# Patient Record
Sex: Male | Born: 1935 | Race: White | Hispanic: No | Marital: Married | Smoking: Never smoker
Health system: Southern US, Community
[De-identification: ages and names within clinical notes are randomized; demographics above are authoritative.]

## PROBLEM LIST (undated history)

## (undated) DIAGNOSIS — I4891 Unspecified atrial fibrillation: Secondary | ICD-10-CM

## (undated) DIAGNOSIS — C61 Malignant neoplasm of prostate: Secondary | ICD-10-CM

## (undated) DIAGNOSIS — C159 Malignant neoplasm of esophagus, unspecified: Secondary | ICD-10-CM

## (undated) DIAGNOSIS — I10 Essential (primary) hypertension: Secondary | ICD-10-CM

## (undated) HISTORY — PX: PROSTATE SURGERY: SHX751

## (undated) HISTORY — PX: APPENDECTOMY: SHX54

---

## 2006-01-22 ENCOUNTER — Ambulatory Visit (HOSPITAL_COMMUNITY): Admission: RE | Admit: 2006-01-22 | Discharge: 2006-01-22 | Payer: Self-pay | Admitting: Urology

## 2006-01-25 ENCOUNTER — Ambulatory Visit: Admission: RE | Admit: 2006-01-25 | Discharge: 2006-05-22 | Payer: Self-pay | Admitting: Radiation Oncology

## 2006-03-14 ENCOUNTER — Ambulatory Visit (HOSPITAL_BASED_OUTPATIENT_CLINIC_OR_DEPARTMENT_OTHER): Admission: RE | Admit: 2006-03-14 | Discharge: 2006-03-14 | Payer: Self-pay | Admitting: Urology

## 2006-05-09 ENCOUNTER — Ambulatory Visit: Admission: RE | Admit: 2006-05-09 | Discharge: 2006-05-22 | Payer: Self-pay | Admitting: Radiation Oncology

## 2008-01-31 ENCOUNTER — Emergency Department (HOSPITAL_BASED_OUTPATIENT_CLINIC_OR_DEPARTMENT_OTHER): Admission: EM | Admit: 2008-01-31 | Discharge: 2008-01-31 | Payer: Self-pay | Admitting: Emergency Medicine

## 2010-07-28 NOTE — Op Note (Signed)
NAMEAYHAM, WORD                ACCOUNT NO.:  [id number]   MEDICAL RECORD NO.:  [id number]          PATIENT TYPE:  AMB   LOCATION:  [facility]                         FACILITY:  [facility]   PHYSICIAN:  Boston Service, M.D.DATE OF BIRTH:  April 02, 1931   DATE OF PROCEDURE:  03/14/2006  DATE OF DISCHARGE:                               OPERATIVE REPORT   PREOPERATIVE DIAGNOSIS:  A 78 year old male with recent biopsy of the  prostate showed a Gleason 6 adenocarcinoma involving 10% of the biopsies  on the right, 10% of the biopsies on the left.  Reference is made to  office notes from 01/03/2006, 12/28/2005, 12/25/2005, as well as  clinical notes from Dr. Dayton Scrape and Dr. Kathrynn Speed.   POSTOPERATIVE DIAGNOSIS:  A 77 year old male with recent biopsy of the  prostate showed a Gleason 6 adenocarcinoma involving 10% of the biopsies  on the right, 10% of the biopsies on the left.  Reference is made to  office notes from 01/03/2006, 12/28/2005, 12/25/2005, as well as  clinical notes from Dr. Dayton Scrape and Dr. Kathrynn Speed.   PROCEDURE:  Transperineal placement of radioactive I-125 seeds.   DESCRIPTION OF PROCEDURE:  The patient was prepped and draped in the  dorsal lithotomy position after institution of adequate level of general  anesthesia.  Rectal tube, transrectal ultrasound probe, indwelling Foley  catheter and suprapubic fluoroscopy unit were all positioned.  Duplicate  transverse and longitudinal images of the prostate were obtained in  order to aid with planting of the implant.  Once sequential transverse  and longitudinal images had been obtained,  [employer] was used  to plan the implant.  Once appropriate planning had been carried out, a  total of 22 needles were used to deliver total of 66 seeds.  Activity  per seed 28.0500 mCi per seed of a I-125 isotope.  Seeds passed without  difficulty.  Placement appeared appropriate by transrectal ultrasound,  as well as suprapubic fluoroscopy.  Once all  seeds had been positioned  appropriate fluoroscopic images were obtained.  Rectal tube, transrectal  ultrasound probe and indwelling Foley catheter were removed.  Fiberoptic  cystoscopy showed a normal urethra and sphincter, nonobstructive  prostate.  Bladder showed clear reflux right and left orifice and no  evidence of bleeding site or seed within the bladder.  Cystoscope was  removed.  Foley catheter was placed to straight drain.  The patient was  returned to recovery in satisfactory condition.           ______________________________  Boston Service, M.D.     RH/MEDQ  D:  03/14/2006  T:  03/14/2006  Job:  [id number]   cc:   Maryln Gottron, M.D.  Fax: [phone]   Karle Plumber, MD  [address]  [place], [place] [address]

## 2011-03-28 ENCOUNTER — Encounter (HOSPITAL_BASED_OUTPATIENT_CLINIC_OR_DEPARTMENT_OTHER): Payer: Medicare Other | Attending: Plastic Surgery

## 2011-03-28 ENCOUNTER — Other Ambulatory Visit (HOSPITAL_COMMUNITY): Payer: Self-pay | Admitting: Plastic Surgery

## 2011-03-28 ENCOUNTER — Ambulatory Visit (HOSPITAL_COMMUNITY)
Admission: RE | Admit: 2011-03-28 | Discharge: 2011-03-28 | Disposition: A | Discharge status: Home / Self Care | Payer: Medicare Other | Source: Ambulatory Visit | Attending: Plastic Surgery | Admitting: Plastic Surgery

## 2011-03-28 DIAGNOSIS — Z79899 Other long term (current) drug therapy: Secondary | ICD-10-CM | POA: Insufficient documentation

## 2011-03-28 DIAGNOSIS — Y849 Medical procedure, unspecified as the cause of abnormal reaction of the patient, or of later complication, without mention of misadventure at the time of the procedure: Secondary | ICD-10-CM | POA: Insufficient documentation

## 2011-03-28 DIAGNOSIS — T8189XA Other complications of procedures, not elsewhere classified, initial encounter: Secondary | ICD-10-CM | POA: Insufficient documentation

## 2011-03-28 DIAGNOSIS — C61 Malignant neoplasm of prostate: Secondary | ICD-10-CM | POA: Insufficient documentation

## 2011-03-28 DIAGNOSIS — I10 Essential (primary) hypertension: Secondary | ICD-10-CM | POA: Insufficient documentation

## 2011-03-28 DIAGNOSIS — M278 Other specified diseases of jaws: Secondary | ICD-10-CM | POA: Insufficient documentation

## 2011-03-28 DIAGNOSIS — Z01818 Encounter for other preprocedural examination: Secondary | ICD-10-CM | POA: Insufficient documentation

## 2011-03-28 DIAGNOSIS — Y842 Radiological procedure and radiotherapy as the cause of abnormal reaction of the patient, or of later complication, without mention of misadventure at the time of the procedure: Secondary | ICD-10-CM | POA: Insufficient documentation

## 2011-03-28 DIAGNOSIS — C01 Malignant neoplasm of base of tongue: Secondary | ICD-10-CM | POA: Insufficient documentation

## 2011-03-28 DIAGNOSIS — I4891 Unspecified atrial fibrillation: Secondary | ICD-10-CM | POA: Insufficient documentation

## 2011-03-29 NOTE — Progress Notes (Signed)
[facility]  NAME:  Daniel Jefferson, Daniel Jefferson                ACCOUNT NO.:  [id number]  MEDICAL RECORD NO.:  [id number]      DATE OF BIRTH:  Aug 25, 1931  PHYSICIAN:  Wayland Denis, DO       VISIT DATE:  03/28/2011                                  OFFICE VISIT   CHIEF COMPLAINT:  Osteoradionecrosis.  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  He received treatment that included the surgical resection and radiation in 2010.  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 This is painful and is also evident in the x-ray for the extensive disease involved.  He has an impacted 3rd molar on the right mandible as well.  There are signs of fistula formation at the root level as well. Originally for his surgical intervention, he did undergo left selected neck dissection with partial glossectomy which was done in September 2009.  He received 30 treatments of 66 Gy for the radiation portion of his treatment.  PAST MEDICAL HISTORY:  Prostate cancer with seed placement, high blood pressure.  Atrial fibrillation and squamous cell carcinoma of the tongue.  MEDICATIONS:  Lisinopril, metoprolol, hydrochlorothiazide, multivitamin and aspirin.  ALLERGIES:  No known drug allergies.  PAST SURGICAL HISTORY:  Appendectomy, prostate seeds, head and neck surgery for tongue cancer.  SOCIAL HISTORY:  The patient is married and lives at home.  Denies smoking or drinking.  REVIEW OF SYSTEMS:  He had lost some weight secondary to his head and neck treatments and now extensive periodontal disease.  Denies any other issues such as shortness of breath, chest pain, blood in his urine  or stool.  On exam, he is alert, oriented, and cooperative, not in any acute distress.  He has a well-healed scars in the mouth and neck area.  His pupils are equal.  Extraocular muscles are intact.  No cervical lymphadenopathy or tenderness in the neck.  He has some pain in the mouth region and the periodontal  region.  His breathing is unlabored. His heart is regular rate, irregular rhythm.  Abdomen is soft, nontender.  Pulses are strong and regular.  He has some varicosities in his lower extremity, but no extensive disease or swelling.  His oral exam shows extensive cracking, fracturing and decay in the teeth with ulcerations of the gums and signs of fistula formation.  ASSESSMENT:  Osteoradionecrosis with light effect radiation changes.  PLAN:  Strict oral hygiene.  Antibiotics as the disease progresses and recommend hyperbaric oxygen treatment as a modality for preventing extensive fracturing of the bones.     Wayland Denis, DO     CS/MEDQ  D:  03/28/2011  T:  03/29/2011  Job:  [id number]

## 2011-04-16 ENCOUNTER — Encounter (HOSPITAL_BASED_OUTPATIENT_CLINIC_OR_DEPARTMENT_OTHER): Payer: Medicare Other | Attending: Plastic Surgery

## 2011-04-16 DIAGNOSIS — Y842 Radiological procedure and radiotherapy as the cause of abnormal reaction of the patient, or of later complication, without mention of misadventure at the time of the procedure: Secondary | ICD-10-CM | POA: Insufficient documentation

## 2011-04-16 DIAGNOSIS — M278 Other specified diseases of jaws: Secondary | ICD-10-CM | POA: Insufficient documentation

## 2011-05-02 ENCOUNTER — Encounter (HOSPITAL_BASED_OUTPATIENT_CLINIC_OR_DEPARTMENT_OTHER): Payer: Medicare Other

## 2011-05-11 ENCOUNTER — Encounter (HOSPITAL_BASED_OUTPATIENT_CLINIC_OR_DEPARTMENT_OTHER): Payer: Medicare Other | Attending: Plastic Surgery

## 2011-05-11 DIAGNOSIS — Y842 Radiological procedure and radiotherapy as the cause of abnormal reaction of the patient, or of later complication, without mention of misadventure at the time of the procedure: Secondary | ICD-10-CM | POA: Insufficient documentation

## 2011-05-11 DIAGNOSIS — K117 Disturbances of salivary secretion: Secondary | ICD-10-CM | POA: Insufficient documentation

## 2011-05-11 DIAGNOSIS — Z85810 Personal history of malignant neoplasm of tongue: Secondary | ICD-10-CM | POA: Insufficient documentation

## 2011-05-11 DIAGNOSIS — M278 Other specified diseases of jaws: Secondary | ICD-10-CM | POA: Insufficient documentation

## 2011-05-11 DIAGNOSIS — Z8546 Personal history of malignant neoplasm of prostate: Secondary | ICD-10-CM | POA: Insufficient documentation

## 2011-05-11 DIAGNOSIS — Z79899 Other long term (current) drug therapy: Secondary | ICD-10-CM | POA: Insufficient documentation

## 2011-05-11 DIAGNOSIS — K006 Disturbances in tooth eruption: Secondary | ICD-10-CM | POA: Insufficient documentation

## 2011-06-15 ENCOUNTER — Encounter (HOSPITAL_BASED_OUTPATIENT_CLINIC_OR_DEPARTMENT_OTHER): Payer: Medicare Other | Attending: Plastic Surgery

## 2011-06-15 DIAGNOSIS — Y842 Radiological procedure and radiotherapy as the cause of abnormal reaction of the patient, or of later complication, without mention of misadventure at the time of the procedure: Secondary | ICD-10-CM | POA: Insufficient documentation

## 2011-06-15 DIAGNOSIS — M278 Other specified diseases of jaws: Secondary | ICD-10-CM | POA: Insufficient documentation

## 2011-06-15 DIAGNOSIS — Z79899 Other long term (current) drug therapy: Secondary | ICD-10-CM | POA: Insufficient documentation

## 2011-06-15 DIAGNOSIS — Z85810 Personal history of malignant neoplasm of tongue: Secondary | ICD-10-CM | POA: Insufficient documentation

## 2011-06-15 DIAGNOSIS — K006 Disturbances in tooth eruption: Secondary | ICD-10-CM | POA: Insufficient documentation

## 2011-06-15 DIAGNOSIS — K117 Disturbances of salivary secretion: Secondary | ICD-10-CM | POA: Insufficient documentation

## 2011-06-15 DIAGNOSIS — Z8546 Personal history of malignant neoplasm of prostate: Secondary | ICD-10-CM | POA: Insufficient documentation

## 2014-05-12 ENCOUNTER — Emergency Department (HOSPITAL_BASED_OUTPATIENT_CLINIC_OR_DEPARTMENT_OTHER): Payer: Medicare HMO

## 2014-05-12 ENCOUNTER — Encounter (HOSPITAL_BASED_OUTPATIENT_CLINIC_OR_DEPARTMENT_OTHER): Payer: Self-pay | Admitting: *Deleted

## 2014-05-12 ENCOUNTER — Observation Stay (HOSPITAL_BASED_OUTPATIENT_CLINIC_OR_DEPARTMENT_OTHER)
Admission: EM | Admit: 2014-05-12 | Discharge: 2014-05-14 | Disposition: A | Discharge status: Home / Self Care | Payer: Medicare HMO | Attending: Internal Medicine | Admitting: Internal Medicine

## 2014-05-12 DIAGNOSIS — I2510 Atherosclerotic heart disease of native coronary artery without angina pectoris: Secondary | ICD-10-CM | POA: Diagnosis not present

## 2014-05-12 DIAGNOSIS — C61 Malignant neoplasm of prostate: Secondary | ICD-10-CM | POA: Insufficient documentation

## 2014-05-12 DIAGNOSIS — C159 Malignant neoplasm of esophagus, unspecified: Secondary | ICD-10-CM | POA: Diagnosis present

## 2014-05-12 DIAGNOSIS — I10 Essential (primary) hypertension: Secondary | ICD-10-CM | POA: Diagnosis present

## 2014-05-12 DIAGNOSIS — N179 Acute kidney failure, unspecified: Secondary | ICD-10-CM | POA: Diagnosis present

## 2014-05-12 DIAGNOSIS — R0600 Dyspnea, unspecified: Secondary | ICD-10-CM

## 2014-05-12 DIAGNOSIS — I482 Chronic atrial fibrillation, unspecified: Secondary | ICD-10-CM

## 2014-05-12 DIAGNOSIS — R0609 Other forms of dyspnea: Secondary | ICD-10-CM | POA: Diagnosis not present

## 2014-05-12 DIAGNOSIS — R911 Solitary pulmonary nodule: Secondary | ICD-10-CM | POA: Diagnosis present

## 2014-05-12 DIAGNOSIS — R0602 Shortness of breath: Secondary | ICD-10-CM | POA: Diagnosis present

## 2014-05-12 HISTORY — DX: Unspecified atrial fibrillation: I48.91

## 2014-05-12 HISTORY — DX: Essential (primary) hypertension: I10

## 2014-05-12 HISTORY — DX: Malignant neoplasm of esophagus, unspecified: C15.9

## 2014-05-12 HISTORY — DX: Malignant neoplasm of prostate: C61

## 2014-05-12 LAB — CBC WITH DIFFERENTIAL/PLATELET
BASOS PCT: 0 % (ref 0–1)
Basophils Absolute: 0 10*3/uL (ref 0.0–0.1)
EOS ABS: 0 10*3/uL (ref 0.0–0.7)
EOS PCT: 0 % (ref 0–5)
HCT: 40.4 % (ref 39.0–52.0)
HEMOGLOBIN: 13.6 g/dL (ref 13.0–17.0)
Lymphocytes Relative: 10 % — ABNORMAL LOW (ref 12–46)
Lymphs Abs: 1.1 10*3/uL (ref 0.7–4.0)
MCH: 32 pg (ref 26.0–34.0)
MCHC: 33.7 g/dL (ref 30.0–36.0)
MCV: 95.1 fL (ref 78.0–100.0)
MONOS PCT: 10 % (ref 3–12)
Monocytes Absolute: 1.1 10*3/uL — ABNORMAL HIGH (ref 0.1–1.0)
NEUTROS ABS: 8.9 10*3/uL — AB (ref 1.7–7.7)
NEUTROS PCT: 80 % — AB (ref 43–77)
Platelets: 135 10*3/uL — ABNORMAL LOW (ref 150–400)
RBC: 4.25 MIL/uL (ref 4.22–5.81)
RDW: 13.4 % (ref 11.5–15.5)
WBC: 11.1 10*3/uL — ABNORMAL HIGH (ref 4.0–10.5)

## 2014-05-12 LAB — COMPREHENSIVE METABOLIC PANEL
ALBUMIN: 4.3 g/dL (ref 3.5–5.2)
ALK PHOS: 47 U/L (ref 39–117)
ALT: 21 U/L (ref 0–53)
ANION GAP: 4 — AB (ref 5–15)
AST: 37 U/L (ref 0–37)
BILIRUBIN TOTAL: 0.7 mg/dL (ref 0.3–1.2)
BUN: 32 mg/dL — ABNORMAL HIGH (ref 6–23)
CHLORIDE: 105 mmol/L (ref 96–112)
CO2: 26 mmol/L (ref 19–32)
CREATININE: 1.02 mg/dL (ref 0.50–1.35)
Calcium: 9.1 mg/dL (ref 8.4–10.5)
GFR calc Af Amer: 78 mL/min — ABNORMAL LOW (ref >90)
GFR calc non Af Amer: 67 mL/min — ABNORMAL LOW (ref >90)
Glucose, Bld: 107 mg/dL — ABNORMAL HIGH (ref 70–99)
POTASSIUM: 4.2 mmol/L (ref 3.5–5.1)
Sodium: 135 mmol/L (ref 135–145)
Total Protein: 7 g/dL (ref 6.0–8.3)

## 2014-05-12 LAB — TROPONIN I: Troponin I: <0.03 ng/mL (ref <0.031)

## 2014-05-12 LAB — BRAIN NATRIURETIC PEPTIDE: B NATRIURETIC PEPTIDE 5: 153.6 pg/mL — AB (ref 0.0–100.0)

## 2014-05-12 MED ORDER — IPRATROPIUM-ALBUTEROL 0.5-2.5 (3) MG/3ML IN SOLN
3.0000 mL | Freq: Once | RESPIRATORY_TRACT | Status: AC
  Administered 2014-05-12: 3 mL via RESPIRATORY_TRACT
  Filled 2014-05-12: qty 3

## 2014-05-12 MED ORDER — ACETAMINOPHEN 325 MG PO TABS: 650.0000 mg | ORAL_TABLET | Freq: Four times a day (QID) | ORAL | Status: DC | PRN

## 2014-05-12 MED ORDER — LEVALBUTEROL HCL 0.63 MG/3ML IN NEBU
0.6300 mg | INHALATION_SOLUTION | Freq: Four times a day (QID) | RESPIRATORY_TRACT | Status: DC | PRN
  Administered 2014-05-14: 0.63 mg via RESPIRATORY_TRACT
  Filled 2014-05-12: qty 3

## 2014-05-12 MED ORDER — HYDROMORPHONE HCL 1 MG/ML IJ SOLN: 0.5000 mg | INTRAMUSCULAR | Status: DC | PRN

## 2014-05-12 MED ORDER — ALUM & MAG HYDROXIDE-SIMETH 200-200-20 MG/5ML PO SUSP: 30.0000 mL | Freq: Four times a day (QID) | ORAL | Status: DC | PRN

## 2014-05-12 MED ORDER — OXYCODONE HCL 5 MG PO TABS: 5.0000 mg | ORAL_TABLET | ORAL | Status: DC | PRN

## 2014-05-12 MED ORDER — ENOXAPARIN SODIUM 40 MG/0.4ML ~~LOC~~ SOLN
40.0000 mg | SUBCUTANEOUS | Status: DC
  Administered 2014-05-13: 40 mg via SUBCUTANEOUS
  Filled 2014-05-12 (×2): qty 0.4

## 2014-05-12 MED ORDER — SODIUM CHLORIDE 0.9 % IV SOLN: 250.0000 mL | INTRAVENOUS | Status: DC | PRN

## 2014-05-12 MED ORDER — SODIUM CHLORIDE 0.9 % IJ SOLN
3.0000 mL | Freq: Two times a day (BID) | INTRAMUSCULAR | Status: DC
  Administered 2014-05-13: 3 mL via INTRAVENOUS

## 2014-05-12 MED ORDER — ACETAMINOPHEN 650 MG RE SUPP: 650.0000 mg | Freq: Four times a day (QID) | RECTAL | Status: DC | PRN

## 2014-05-12 MED ORDER — SODIUM CHLORIDE 0.9 % IJ SOLN
3.0000 mL | Freq: Two times a day (BID) | INTRAMUSCULAR | Status: DC
  Administered 2014-05-12 – 2014-05-13 (×3): 3 mL via INTRAVENOUS

## 2014-05-12 MED ORDER — ASPIRIN 325 MG PO TABS
325.0000 mg | ORAL_TABLET | Freq: Every day | ORAL | Status: DC
  Administered 2014-05-13 – 2014-05-14 (×2): 325 mg via ORAL
  Filled 2014-05-12 (×2): qty 1

## 2014-05-12 MED ORDER — ONDANSETRON HCL 4 MG/2ML IJ SOLN: 4.0000 mg | Freq: Four times a day (QID) | INTRAMUSCULAR | Status: DC | PRN

## 2014-05-12 MED ORDER — NITROGLYCERIN 2 % TD OINT
0.5000 [in_us] | TOPICAL_OINTMENT | Freq: Four times a day (QID) | TRANSDERMAL | Status: DC
  Administered 2014-05-12: 0.5 [in_us] via TOPICAL
  Filled 2014-05-12: qty 30

## 2014-05-12 MED ORDER — ASPIRIN 325 MG PO TABS
325.0000 mg | ORAL_TABLET | Freq: Once | ORAL | Status: AC
Start: 2014-05-12 — End: 2014-05-12
  Administered 2014-05-12: 325 mg via ORAL
  Filled 2014-05-12: qty 1

## 2014-05-12 MED ORDER — VITAMIN B COMPLEX PO TABS
1.0000 | ORAL_TABLET | Freq: Every day | ORAL | Status: DC
  Filled 2014-05-12 (×2): qty 1

## 2014-05-12 MED ORDER — SODIUM CHLORIDE 0.9 % IJ SOLN: 3.0000 mL | INTRAMUSCULAR | Status: DC | PRN

## 2014-05-12 MED ORDER — TAMSULOSIN HCL 0.4 MG PO CAPS
0.4000 mg | ORAL_CAPSULE | Freq: Every day | ORAL | Status: DC
  Administered 2014-05-13 – 2014-05-14 (×2): 0.4 mg via ORAL
  Filled 2014-05-12 (×2): qty 1

## 2014-05-12 MED ORDER — LISINOPRIL 10 MG PO TABS
10.0000 mg | ORAL_TABLET | Freq: Every day | ORAL | Status: DC
  Administered 2014-05-13 – 2014-05-14 (×2): 10 mg via ORAL
  Filled 2014-05-12 (×2): qty 1

## 2014-05-12 MED ORDER — IPRATROPIUM-ALBUTEROL 0.5-2.5 (3) MG/3ML IN SOLN
3.0000 mL | RESPIRATORY_TRACT | Status: DC
  Administered 2014-05-12: 3 mL via RESPIRATORY_TRACT
  Filled 2014-05-12: qty 3

## 2014-05-12 MED ORDER — ONDANSETRON HCL 4 MG PO TABS: 4.0000 mg | ORAL_TABLET | Freq: Four times a day (QID) | ORAL | Status: DC | PRN

## 2014-05-12 NOTE — ED Notes (Signed)
RT called to patient's room by RN. Family stated he was having a SOB "spell." When I arrived, patient sitting straight up in bed. HR 64, SAT 100%. BBS clear but upper airway noise noted, not really stridor. Patient stated he was just having a hard time catching his breath. MD aware of what is going on, and is going to assess him. RT to monitor as needed

## 2014-05-12 NOTE — ED Notes (Signed)
Pt reports shortness of breath, worse with lying flat for several days. Pt was seen at [facility] for the same two nights ago, was given nebs, and sent home. Pt says he is still having sob.

## 2014-05-12 NOTE — ED Notes (Signed)
Patient transported to X-ray 

## 2014-05-12 NOTE — ED Notes (Signed)
Pt reports having SHOB only when laying down at night. Denies CP.

## 2014-05-12 NOTE — ED Notes (Signed)
Patient transported to CT 

## 2014-05-12 NOTE — ED Provider Notes (Signed)
CSN: [id number]     Arrival date & time 05/12/14  1151 History   First MD Initiated Contact with Patient 05/12/14 1225     Chief Complaint  Patient presents with  . Shortness of Breath     (Consider location/radiation/quality/duration/timing/severity/associated sxs/prior Treatment) HPI The patient has a known history of chronic atrial fibrillation. He takes a daily aspirin for that. He has history of esophageal cancer, treated and resolved. The patient reports he's been having problems with shortness of breath without associated chest pain. The shortness of breath is episodic. It happens most frequently at night. He reports he has to get up to go to the bathroom frequently during the night and has had to do so for some 40 years. He reports after he gets up and tries to go back to bed, he becomes very short of breath. He reports lying flat makes it much worse. Once this starts he has to sleep sitting up in his chair. The patient denies fever or chills. He denies a smoking history. The patient does not see a cardiologist. The patient was so short of breath 2 nights ago that he is transported by ambulance to [facility]. He reports he was evaluated but not admitted at that time. He reports he was treated with an inhaler treatment but continues to have shortness of breath and is not improving. Past Medical History  Diagnosis Date  . Prostate cancer   . Atrial fibrillation   . Hypertension   . Esophageal cancer    Past Surgical History  Procedure Laterality Date  . Appendectomy    . Prostate surgery     No family history on file. History  Substance Use Topics  . Smoking status: Never Smoker   . Smokeless tobacco: Not on file  . Alcohol Use: No    Review of Systems 10 Systems reviewed and are negative for acute change except as noted in the HPI.    Allergies  Review of patient's allergies indicates not on file.  Home Medications   Prior to Admission medications   Medication  Sig Start Date End Date Taking? Authorizing Provider  aspirin 325 MG tablet Take 325 mg by mouth daily.   Yes Historical Provider, MD  B Complex Vitamins (VITAMIN B COMPLEX PO) Take 100 mg by mouth.   Yes Historical Provider, MD  Calcium Carbonate-Vit D-Min (CALCIUM 1200 PO) Take by mouth.   Yes Historical Provider, MD  Flaxseed Oil OIL by Does not apply route.   Yes Historical Provider, MD  FOLIC ACID PO Take 563 mcg by mouth.   Yes Historical Provider, MD  L-ARGININE-500 PO Take by mouth.   Yes Historical Provider, MD  levothyroxine (SYNTHROID, LEVOTHROID) 75 MCG tablet Take 75 mcg by mouth daily before breakfast.   Yes Historical Provider, MD  LISINOPRIL PO Take 10 mg by mouth.   Yes Historical Provider, MD  milk thistle 175 MG tablet Take 175 mg by mouth daily.   Yes Historical Provider, MD  Suma Root POWD by Does not apply route.   Yes Historical Provider, MD  tamsulosin (FLOMAX) 0.4 MG CAPS capsule Take 0.4 mg by mouth.   Yes Historical Provider, MD  vitamin E 400 UNIT capsule Take 400 Units by mouth daily.   Yes Historical Provider, MD  Zinc Acetate, Oral, (ZINC ACETATE PO) Take by mouth.   Yes Historical Provider, MD   BP 162/75 mmHg  Pulse 68  Temp(Src) 97.9 F (36.6 C) (Oral)  Resp 18  Ht 5'  7" (1.702 m)  Wt 183 lb (83.008 kg)  BMI 28.66 kg/m2  SpO2 97% Physical Exam  Constitutional: He is oriented to person, place, and time. He appears well-developed and well-nourished.  HENT:  Head: Normocephalic and atraumatic.  Eyes: EOM are normal. Pupils are equal, round, and reactive to light.  Neck: Neck supple.  Cardiovascular: Normal rate, regular rhythm, normal heart sounds and intact distal pulses.   Pulmonary/Chest: Effort normal. No respiratory distress. He has wheezes.  The patient has coarse central inspiratory wheezes.  Abdominal: Soft. Bowel sounds are normal. He exhibits no distension. There is no tenderness.  Musculoskeletal: Normal range of motion. He exhibits no  edema.  Neurological: He is alert and oriented to person, place, and time. He has normal strength. Coordination normal. GCS eye subscore is 4. GCS verbal subscore is 5. GCS motor subscore is 6.  Skin: Skin is warm, dry and intact.  Psychiatric: He has a normal mood and affect.    ED Course  Procedures (including critical care time) Labs Review Labs Reviewed  CBC WITH DIFFERENTIAL/PLATELET - Abnormal; Notable for the following:    WBC 11.1 (*)    Platelets 135 (*)    Neutrophils Relative % 80 (*)    Neutro Abs 8.9 (*)    Lymphocytes Relative 10 (*)    Monocytes Absolute 1.1 (*)    All other components within normal limits  COMPREHENSIVE METABOLIC PANEL - Abnormal; Notable for the following:    Glucose, Bld 107 (*)    BUN 32 (*)    GFR calc non Af Amer 67 (*)    GFR calc Af Amer 78 (*)    Anion gap 4 (*)    All other components within normal limits  BRAIN NATRIURETIC PEPTIDE - Abnormal; Notable for the following:    B Natriuretic Peptide 153.6 (*)    All other components within normal limits  TROPONIN I    Imaging Review Dg Chest 2 View  05/12/2014   CLINICAL DATA:  Shortness of breath for 2 days.  Initial encounter.  EXAM: CHEST  2 VIEW  COMPARISON:  Portable chest 05/11/2014.  FINDINGS: Persistent cardiomegaly. No active infiltrates or failure. Old rib fractures are stable on the LEFT. No effusion or pneumothorax. Thoracic atherosclerosis.  IMPRESSION: Stable chest.  No active disease.   Electronically Signed   By: Rolla Flatten M.D.   On: 05/12/2014 13:15   Ct Chest Wo Contrast  05/12/2014   CLINICAL DATA:  Shortness of breath for 2 days.  EXAM: CT CHEST WITHOUT CONTRAST  TECHNIQUE: Multidetector CT imaging of the chest was performed following the standard protocol without IV contrast.  COMPARISON:  05/12/2014 chest x-ray.  Chest CT 03/05/2014.  FINDINGS: Heart is mildly enlarged. Diffuse coronary artery calcifications and aortic calcifications. No aneurysm. Chest wall soft tissues  are unremarkable.  5 mm nodule of the right lung base. This was not definitively seen on prior study. Lungs otherwise clear. No pleural effusions.  Gallstones noted within the gallbladder. No acute findings in the upper abdomen. No acute bony abnormality or focal bone lesion.  IMPRESSION: 5 mm nodule in the right lower lobe at the right lung base. If the patient is at high risk for bronchogenic carcinoma, follow-up chest CT at 6-12 months is recommended. If the patient is at low risk for bronchogenic carcinoma, follow-up chest CT at 12 months is recommended. This recommendation follows the consensus statement: Guidelines for Management of Small Pulmonary Nodules Detected on CT Scans: A Statement  from the [place] as published in Radiology 2005;237:395-400.  Cardiomegaly.  Diffuse coronary artery disease.   Electronically Signed   By: Rolm Baptise M.D.   On: 05/12/2014 14:18     EKG Interpretation   Date/Time:  Wednesday May 12 2014 12:00:17 EST Ventricular Rate:  68 PR Interval:    QRS Duration: 86 QT Interval:  386 QTC Calculation: 410 R Axis:   32 Text Interpretation:  Atrial fibrillation Possible Anterior infarct , age  undetermined Abnormal ECG agree. no STEMI. no change. Confirmed by  Johnney Killian, MD, Jeannie Done [phone]) on 05/12/2014 12:05:37 PM      MDM   Final diagnoses:  Dyspnea  Paroxysmal nocturnal dyspnea  Chronic atrial fibrillation  Coronary artery disease involving native coronary artery of native heart without angina pectoris   The patient remains stable with chronic A. fib. He will be admitted for further evaluation of paroxysmal dyspnea. As for an anginal equivalent with coronary artery disease.    Charlesetta Shanks, MD 05/12/14 [phone]

## 2014-05-12 NOTE — ED Notes (Signed)
Attempt to call report nurse not available will return call

## 2014-05-12 NOTE — Progress Notes (Signed)
Patient presents with Dyspnea, Paroxysmal, orthopnea. Has history of A fib. Need cardiac enzymes, ECHO, monitor on telemetry. CT chest showed lung nodule. Accepted to telemetry.  Niel Hummer, MD.

## 2014-05-12 NOTE — H&P (Signed)
[facility] Admission History and Physical       Gareth Fitzner GEX:[id number] DOB: 1932/02/27 DOA: 05/12/2014  Referring physician: EDP PCP: Mia Creek, MD  Specialists:   Chief Complaint: SOB  HPI: Daniel Jefferson is a 81 y.o. male with a history of Chronic Atrial Fibrillation on ASA RX, HTN, and remote history of Prostate Cancer S/P Radiation Seed Implants, and remote history of Esophageal Cancer S/P Surgical Resection, and Chemo RX and Radiation RX who presented to the [facility] ED with complaints of paroxysms of SOB and Chest pain as well as orthopnea .   He was seen in the ED at [facility] 1 days ago evaluated and released to home, but he continued to have paroxysms of SOB and was brought to the [facility] ED.  He was evaluated in the ED and had a CT scan of the Chest perfomed which revealed  a  70mmLung Nodule in the R Lung.   Cardiac Enzymes were also performed and were negative.  He was referred to [facility] for further evaluation.      Review of Systems:  Constitutional: No Weight Loss, No Weight Gain, Night Sweats, Fevers, Chills, Dizziness, Light Headedness, Fatigue, or Generalized Weakness HEENT: No Headaches, +Difficulty Swallowing ( Chronic),Tooth/Dental Problems,Sore Throat,  No Sneezing, Rhinitis, Ear Ache, Nasal Congestion, or Post Nasal Drip,  Cardio-vascular:     +Chest pain,+Orthopnea, +PND, Edema in Lower Extremities, Anasarca, Dizziness, Palpitations  Resp: No Dyspnea, No DOE, No Productive Cough, No Non-Productive Cough, No Hemoptysis, No Wheezing.    GI: No Heartburn, Indigestion, Abdominal Pain, Nausea, Vomiting, Diarrhea, Constipation, Hematemesis, Hematochezia, Melena, Change in Bowel Habits,  Loss of Appetite  GU: No Dysuria, No Change in Color of Urine, No Urgency or Urinary Frequency, No Flank pain.  Musculoskeletal: No Joint Pain or Swelling, No Decreased Range of Motion, No Back Pain.  Neurologic: No Syncope, No Seizures, Muscle Weakness, Paresthesia,  Vision Disturbance or Loss, No Diplopia, No Vertigo, No Difficulty Walking,  Skin: No Rash or Lesions. Psych: No Change in Mood or Affect, No Depression or Anxiety, No Memory loss, No Confusion, or Hallucinations   Past Medical History  Diagnosis Date  . Prostate cancer   . Atrial fibrillation   . Hypertension   . Esophageal cancer      Past Surgical History  Procedure Laterality Date  . Appendectomy    . Prostate surgery        Prior to Admission medications   Medication Sig Start Date End Date Taking? Authorizing Provider  Ascorbic Acid (VITAMIN C) 1000 MG tablet Take 1,000 mg by mouth daily.   Yes Historical Provider, MD  aspirin 325 MG tablet Take 325 mg by mouth daily.   Yes Historical Provider, MD  B Complex Vitamins (VITAMIN B COMPLEX PO) Take 100 mg by mouth daily.    Yes Historical Provider, MD  Calcium Carbonate-Vit D-Min (CALCIUM 1200 PO) Take 1 tablet by mouth daily.    Yes Historical Provider, MD  Cholecalciferol 2000 UNITS TABS Take 2,000 Units by mouth daily.   Yes Historical Provider, MD  Coenzyme Q10 (CO Q 10) 100 MG CAPS Take 100 mg by mouth daily.   Yes Historical Provider, MD  FOLIC ACID PO Take 010 mcg by mouth daily.    Yes Historical Provider, MD  L-ARGININE-500 PO Take 1 tablet by mouth daily.    Yes Historical Provider, MD  levothyroxine (SYNTHROID, LEVOTHROID) 75 MCG tablet Take 75 mcg by mouth daily before breakfast.   Yes Historical Provider,  MD  lisinopril (PRINIVIL,ZESTRIL) 10 MG tablet Take 10 mg by mouth daily.   Yes Historical Provider, MD  magnesium oxide (MAG-OX) 400 MG tablet Take 400 mg by mouth daily.   Yes Historical Provider, MD  milk thistle 175 MG tablet Take 175 mg by mouth daily.   Yes Historical Provider, MD  Suma Root POWD Take 1 tablet by mouth 3 (three) times daily.    Yes Historical Provider, MD  tamsulosin (FLOMAX) 0.4 MG CAPS capsule Take 0.4 mg by mouth daily.    Yes Historical Provider, MD  vitamin E 400 UNIT capsule Take 400  Units by mouth daily.   Yes Historical Provider, MD  Zinc Acetate, Oral, (ZINC ACETATE PO) Take 65 mg by mouth daily.    Yes Historical Provider, MD     Not on File  Social History:  reports that he has never smoked. He does not have any smokeless tobacco history on file. He reports that he does not drink alcohol. His drug history is not on file.      No family history on file.     Physical Exam:  GEN:  Pleasant Well Nourished and Well Developed Elderly 81 y.o. Caucasian male examined and in no acute distress; cooperative with exam Filed Vitals:   05/12/14 1331 05/12/14 1516 05/12/14 1800 05/12/14 1913  BP:  156/122  140/72  Pulse:  90 65   Temp:    97.7 F (36.5 C)  TempSrc:    Oral  Resp:  18 15 16   Height:    5\' 7"  (1.702 m)  Weight:    82.101 kg (181 lb)  SpO2: 97% 97% 99% 96%   Blood pressure 140/72, pulse 65, temperature 97.7 F (36.5 C), temperature source Oral, resp. rate 16, height 5\' 7"  (1.702 m), weight 82.101 kg (181 lb), SpO2 96 %. PSYCH: He is alert and oriented x4; does not appear anxious does not appear depressed; affect is normal HEENT: Normocephalic and Atraumatic, Mucous membranes pink; PERRLA; EOM intact; Fundi:  Benign;  No scleral icterus, Nares: Patent, Oropharynx: Clear, Fair Dentition,    Neck:  FROM, No Cervical Lymphadenopathy nor Thyromegaly or Carotid Bruit; No JVD; Breasts:: Not examined CHEST WALL: No tenderness CHEST: Normal respiration, clear to auscultation bilaterally HEART: Irregular rhythm; no murmurs rubs or gallops BACK: No kyphosis or scoliosis; No CVA tenderness ABDOMEN: Positive Bowel Sounds,  Soft Non-Tender, No Rebound or Guarding; No Masses, No Organomegaly. Rectal Exam: Not done EXTREMITIES: No Cyanosis, Clubbing, or Edema; No Ulcerations. Genitalia: not examined PULSES: 2+ and symmetric SKIN: Normal hydration no rash or ulceration CNS:  Alert and Oriented x 4, No Focal Deficits Vascular: pulses palpable throughout    Labs  on Admission:  Basic Metabolic Panel:  Recent Labs Lab 05/12/14 1220  NA 135  K 4.2  CL 105  CO2 26  GLUCOSE 107*  BUN 32*  CREATININE 1.02  CALCIUM 9.1   Liver Function Tests:  Recent Labs Lab 05/12/14 1220  AST 37  ALT 21  ALKPHOS 47  BILITOT 0.7  PROT 7.0  ALBUMIN 4.3   No results for input(s): LIPASE, AMYLASE in the last 168 hours. No results for input(s): AMMONIA in the last 168 hours. CBC:  Recent Labs Lab 05/12/14 1220  WBC 11.1*  NEUTROABS 8.9*  HGB 13.6  HCT 40.4  MCV 95.1  PLT 135*   Cardiac Enzymes:  Recent Labs Lab 05/12/14 1220  TROPONINI <0.03    BNP (last 3 results)  Recent Labs  05/12/14 1220  BNP 153.6*    ProBNP (last 3 results) No results for input(s): PROBNP in the last 8760 hours.  CBG: No results for input(s): GLUCAP in the last 168 hours.  Radiological Exams on Admission: Dg Chest 2 View  05/12/2014   CLINICAL DATA:  Shortness of breath for 2 days.  Initial encounter.  EXAM: CHEST  2 VIEW  COMPARISON:  Portable chest 05/11/2014.  FINDINGS: Persistent cardiomegaly. No active infiltrates or failure. Old rib fractures are stable on the LEFT. No effusion or pneumothorax. Thoracic atherosclerosis.  IMPRESSION: Stable chest.  No active disease.   Electronically Signed   By: Rolla Flatten M.D.   On: 05/12/2014 13:15   Ct Chest Wo Contrast  05/12/2014   CLINICAL DATA:  Shortness of breath for 2 days.  EXAM: CT CHEST WITHOUT CONTRAST  TECHNIQUE: Multidetector CT imaging of the chest was performed following the standard protocol without IV contrast.  COMPARISON:  05/12/2014 chest x-ray.  Chest CT 03/05/2014.  FINDINGS: Heart is mildly enlarged. Diffuse coronary artery calcifications and aortic calcifications. No aneurysm. Chest wall soft tissues are unremarkable.  5 mm nodule of the right lung base. This was not definitively seen on prior study. Lungs otherwise clear. No pleural effusions.  Gallstones noted within the gallbladder. No acute  findings in the upper abdomen. No acute bony abnormality or focal bone lesion.  IMPRESSION: 5 mm nodule in the right lower lobe at the right lung base. If the patient is at high risk for bronchogenic carcinoma, follow-up chest CT at 6-12 months is recommended. If the patient is at low risk for bronchogenic carcinoma, follow-up chest CT at 12 months is recommended. This recommendation follows the consensus statement: Guidelines for Management of Small Pulmonary Nodules Detected on CT Scans: A Statement from the [place] as published in Radiology 2005;237:395-400.  Cardiomegaly.  Diffuse coronary artery disease.   Electronically Signed   By: Rolm Baptise M.D.   On: 05/12/2014 14:18     EKG: Independently reviewed.    Assessment/Plan:   81 y.o. male with  Principal Problem:   1.    SOB (shortness of breath)/ Paroxysmal nocturnal dyspnea   Telemetry Monitoring   Cycle Troponins   Monitor O2 Sats   NCO2 PRN   Active Problems:    2.   Coronary artery disease involving native coronary artery of native heart without angina pectoris   Telemetry Monitoring   Cycle Troponins      3.   Lung nodule, solitary-  67mm seen on CT scan   Consult Pulmonary in AM    Consider PET scan      4.   Chronic atrial fibrillation- Refused Coumadin Rx    On ASA Rx      5.   Hypertension   Continue Lisinopril RX   Monitor BPs      6.   Prostate cancer   Hx of Radiation Seed Implants      7.   Esophageal cancer   Hx of Resection, Chemotherapy and Radiation Rx      8.   AKI-     Gentle IVFs   Monitor BUN.Cr       9.   DVT Prophylaxis   Lovenox         Code Status:     FULL CODE        Family Communication:   Family at Bedside    Disposition Plan:     Observation Status  Time spent:  [facility] Pager [phone]   If [place] Please Contact the Day Rounding Team MD for [facility]  If 7PM-7AM, Please Contact Night-Floor  Coverage  [email or web address] Password [email or web address] 05/12/2014, 9:22 PM     ADDENDUM:   Patient was seen and examined on 05/12/2014

## 2014-05-13 DIAGNOSIS — N179 Acute kidney failure, unspecified: Secondary | ICD-10-CM

## 2014-05-13 DIAGNOSIS — R0600 Dyspnea, unspecified: Secondary | ICD-10-CM

## 2014-05-13 DIAGNOSIS — I10 Essential (primary) hypertension: Secondary | ICD-10-CM

## 2014-05-13 DIAGNOSIS — R911 Solitary pulmonary nodule: Secondary | ICD-10-CM

## 2014-05-13 DIAGNOSIS — R0602 Shortness of breath: Secondary | ICD-10-CM

## 2014-05-13 DIAGNOSIS — C61 Malignant neoplasm of prostate: Secondary | ICD-10-CM

## 2014-05-13 DIAGNOSIS — I482 Chronic atrial fibrillation: Secondary | ICD-10-CM

## 2014-05-13 DIAGNOSIS — I2510 Atherosclerotic heart disease of native coronary artery without angina pectoris: Secondary | ICD-10-CM

## 2014-05-13 DIAGNOSIS — C159 Malignant neoplasm of esophagus, unspecified: Secondary | ICD-10-CM

## 2014-05-13 LAB — CBC
HCT: 38.8 % — ABNORMAL LOW (ref 39.0–52.0)
Hemoglobin: 13.4 g/dL (ref 13.0–17.0)
MCH: 32.4 pg (ref 26.0–34.0)
MCHC: 34.5 g/dL (ref 30.0–36.0)
MCV: 93.9 fL (ref 78.0–100.0)
PLATELETS: 121 10*3/uL — AB (ref 150–400)
RBC: 4.13 MIL/uL — ABNORMAL LOW (ref 4.22–5.81)
RDW: 13.9 % (ref 11.5–15.5)
WBC: 6.1 10*3/uL (ref 4.0–10.5)

## 2014-05-13 LAB — BASIC METABOLIC PANEL
ANION GAP: 8 (ref 5–15)
BUN: 28 mg/dL — ABNORMAL HIGH (ref 6–23)
CO2: 26 mmol/L (ref 19–32)
CREATININE: 1.13 mg/dL (ref 0.50–1.35)
Calcium: 9 mg/dL (ref 8.4–10.5)
Chloride: 105 mmol/L (ref 96–112)
GFR, EST AFRICAN AMERICAN: 69 mL/min — AB (ref >90)
GFR, EST NON AFRICAN AMERICAN: 59 mL/min — AB (ref >90)
Glucose, Bld: 81 mg/dL (ref 70–99)
Potassium: 4.2 mmol/L (ref 3.5–5.1)
Sodium: 139 mmol/L (ref 135–145)

## 2014-05-13 LAB — TROPONIN I
TROPONIN I: 0.03 ng/mL (ref <0.031)
Troponin I: <0.03 ng/mL (ref <0.031)

## 2014-05-13 MED ORDER — B COMPLEX-C PO TABS
1.0000 | ORAL_TABLET | Freq: Every day | ORAL | Status: DC
  Administered 2014-05-13 – 2014-05-14 (×2): 1 via ORAL
  Filled 2014-05-13 (×2): qty 1

## 2014-05-13 MED ORDER — LEVOTHYROXINE SODIUM 75 MCG PO TABS
75.0000 ug | ORAL_TABLET | Freq: Every day | ORAL | Status: DC
  Administered 2014-05-14: 75 ug via ORAL
  Filled 2014-05-13: qty 1

## 2014-05-13 NOTE — Progress Notes (Signed)
[facility] PROGRESS NOTE   Daniel Jefferson GDJ:[id number] DOB: January 04, 1932 DOA: 05/12/2014 PCP: Mia Creek, MD  HPI/Subjective: Seen with son at bedside, denies any shortness of breath.  Assessment/Plan: Principal Problem:   SOB (shortness of breath) Active Problems:   Lung nodule, solitary   Chronic atrial fibrillation   Paroxysmal nocturnal dyspnea   Hypertension   Prostate cancer   Coronary artery disease involving native coronary artery of native heart without angina pectoris   Esophageal cancer   AKI (acute kidney injury)   Shortness of breath Patient complains about paroxysmal shortness of breath without clear precipitating factors. Denies any chest pain without, denies any sweating or nausea. 2-D echo pending. Cardiology consulted, apparently patient had an episode while Dr. Loletha Grayer is in the room. This appears to be upper airway problem, no evidence of acute pulmonary edema. If 2-D echo showed no acute events, patient might need to follow-up with ENT as outpatient.  Chronic atrial fibrillation Rate is controlled, continue home medications. Patient is not on anticoagulation, refused Eliquis, wants to continue aspirin.  Lung nodule Patient has history of 2 primary cancers including prostate and HEENT cancer. Recommend to follow 5 mm nodule as outpatient very closely.  Hypertension Is controlled, continue home medications.  Prostate and esophageal cancer Remote cancer, both treated with resection and radiation. Pulmonary nodule, solitary, follows outpatient.   Code Status: Full Code Family Communication: Plan discussed with the patient. Disposition Plan: Remains inpatient Diet: Diet Heart  Consultants:  Cardiology  Procedures:  2D echo pending  Antibiotics:  None   Objective: Filed Vitals:   05/13/14 0456  BP: 112/57  Pulse: 66  Temp: 97.4 F (36.3 C)  Resp: 16    Intake/Output Summary (Last 24 hours) at 05/13/14 1251 Last data filed  at 05/13/14 0900  Gross per 24 hour  Intake    240 ml  Output      0 ml  Net    240 ml   Filed Weights   05/12/14 1156 05/12/14 1913 05/13/14 0456  Weight: 83.008 kg (183 lb) 82.101 kg (181 lb) 82.101 kg (181 lb)    Exam: General: Alert and awake, oriented x3, not in any acute distress. HEENT: anicteric sclera, pupils reactive to light and accommodation, EOMI CVS: S1-S2 clear, no murmur rubs or gallops Chest: clear to auscultation bilaterally, no wheezing, rales or rhonchi Abdomen: soft nontender, nondistended, normal bowel sounds, no organomegaly Extremities: no cyanosis, clubbing or edema noted bilaterally Neuro: Cranial nerves II-XII intact, no focal neurological deficits  Data Reviewed: Basic Metabolic Panel:  Recent Labs Lab 05/12/14 1220 05/13/14 0645  NA 135 139  K 4.2 4.2  CL 105 105  CO2 26 26  GLUCOSE 107* 81  BUN 32* 28*  CREATININE 1.02 1.13  CALCIUM 9.1 9.0   Liver Function Tests:  Recent Labs Lab 05/12/14 1220  AST 37  ALT 21  ALKPHOS 47  BILITOT 0.7  PROT 7.0  ALBUMIN 4.3   No results for input(s): LIPASE, AMYLASE in the last 168 hours. No results for input(s): AMMONIA in the last 168 hours. CBC:  Recent Labs Lab 05/12/14 1220 05/13/14 0645  WBC 11.1* 6.1  NEUTROABS 8.9*  --   HGB 13.6 13.4  HCT 40.4 38.8*  MCV 95.1 93.9  PLT 135* 121*   Cardiac Enzymes:  Recent Labs Lab 05/12/14 1220 05/12/14 2100 05/13/14 0109 05/13/14 0645  TROPONINI <0.03 <0.03 0.03 <0.03   BNP (last 3 results)  Recent Labs  05/12/14 1220  BNP 153.6*  ProBNP (last 3 results) No results for input(s): PROBNP in the last 8760 hours.  CBG: No results for input(s): GLUCAP in the last 168 hours.  Micro No results found for this or any previous visit (from the past 240 hour(s)).   Studies: Dg Chest 2 View  05/12/2014   CLINICAL DATA:  Shortness of breath for 2 days.  Initial encounter.  EXAM: CHEST  2 VIEW  COMPARISON:  Portable chest 05/11/2014.   FINDINGS: Persistent cardiomegaly. No active infiltrates or failure. Old rib fractures are stable on the LEFT. No effusion or pneumothorax. Thoracic atherosclerosis.  IMPRESSION: Stable chest.  No active disease.   Electronically Signed   By: Rolla Flatten M.D.   On: 05/12/2014 13:15   Ct Chest Wo Contrast  05/12/2014   CLINICAL DATA:  Shortness of breath for 2 days.  EXAM: CT CHEST WITHOUT CONTRAST  TECHNIQUE: Multidetector CT imaging of the chest was performed following the standard protocol without IV contrast.  COMPARISON:  05/12/2014 chest x-ray.  Chest CT 03/05/2014.  FINDINGS: Heart is mildly enlarged. Diffuse coronary artery calcifications and aortic calcifications. No aneurysm. Chest wall soft tissues are unremarkable.  5 mm nodule of the right lung base. This was not definitively seen on prior study. Lungs otherwise clear. No pleural effusions.  Gallstones noted within the gallbladder. No acute findings in the upper abdomen. No acute bony abnormality or focal bone lesion.  IMPRESSION: 5 mm nodule in the right lower lobe at the right lung base. If the patient is at high risk for bronchogenic carcinoma, follow-up chest CT at 6-12 months is recommended. If the patient is at low risk for bronchogenic carcinoma, follow-up chest CT at 12 months is recommended. This recommendation follows the consensus statement: Guidelines for Management of Small Pulmonary Nodules Detected on CT Scans: A Statement from the [place] as published in Radiology 2005;237:395-400.  Cardiomegaly.  Diffuse coronary artery disease.   Electronically Signed   By: Rolm Baptise M.D.   On: 05/12/2014 14:18    Scheduled Meds: . aspirin  325 mg Oral Daily  . B-complex with vitamin C  1 tablet Oral Daily  . enoxaparin (LOVENOX) injection  40 mg Subcutaneous Q24H  . [START ON 05/14/2014] levothyroxine  75 mcg Oral QAC breakfast  . lisinopril  10 mg Oral Daily  . nitroGLYCERIN  0.5 inch Topical 4 times per day  . sodium  chloride  3 mL Intravenous Q12H  . sodium chloride  3 mL Intravenous Q12H  . tamsulosin  0.4 mg Oral QPC breakfast   Continuous Infusions:      Time spent: 35 minutes    [facility]  [facility] Pager [phone] If 7PM-7AM, please contact night-coverage at [email or web address], password [facility] 05/13/2014, 12:51 PM

## 2014-05-13 NOTE — Progress Notes (Signed)
Myocardial perfusion study and echo performed at [facility] in January 2015 were both normal studies. Copies in paper chart.

## 2014-05-13 NOTE — Progress Notes (Signed)
  Echocardiogram 2D Echocardiogram has been performed.  Diamond Nickel 05/13/2014, 4:13 PM

## 2014-05-13 NOTE — Progress Notes (Signed)
UR completed 

## 2014-05-13 NOTE — Consult Note (Addendum)
Reason for Consult: Dyspnea  Requesting Physician: Hartford Poli  Cardiologist: Claudie Leach  HPI: This is a 81 y.o. male with a past medical history significant for permanent atrial fibrillation (on aspirin, rather than anticoagulants due to personal preference) with a history of head and neck cancer requiring extensive surgical resection and radiation therapy. This occurred roughly 7 years ago and he is considered to be free of disease (ENT Dr. Laurance Flatten in [facility]).   He has discussed the breathing  difficulty with his ENT specialist who performed a laryngoscopy and diagnosed acid reflux. He has been taking a proton pump inhibitor several weeks, without obvious improvement.  He is followed by Dr. Inocencio Homes at [facility] for chronic atrial fibrillation. Reportedly he has had echocardiograms and nuclear stress tests there. These have been "normal". He does not have a diagnosis of congestive heart failure or coronary disease.  He has been having episodes of paroxysmal dyspnea over the last 3 months or so. He reports being seen in the [facility] regional emergency room and told that he had "fluid in his lungs and he is to see a pulmonary specialist".  the symptoms have been steadily worsening. He presented yesterday to [facility] with shortness of breath that occurs in paroxysms. He woke from sleep as he habitually does to use the bathroom (he did not wake secondary to dyspnea) and when he tried to lay back in bed he developed severe dyspnea. He felt better sitting up. While in the med center he had recurrent episodes of severe shortness of breath that would start and stop without any intervention. They last anywhere from 5 minutes to 40 minutes and are associated with loud stridor. His family states that he was not hypoxic during these events but was tachypneic. Inhaled bronchodilators did not help and maybe made the situation worse. he feels that he cannot get enough air and  sometimes is afraid that he will die. Clearly, the symptoms are worse when he lies down.   He had a brief and mild "spell" while I was examining him. He developed sudden stridor and worsening hoarseness. He started taking slow very deep breaths. He did not have any wheezing or difficulty with expiration.  His chest x-ray shows mild cardiomegaly, but no evidence of congestive heart failure. A chest CT shows extensive calcifications especially prominent in the proximal and mid LAD artery and proximal right coronary artery as well as thoracic aortic calcification. Again, there is no evidence of heart failure type infiltrates.  He has never smoked. He has treated hypertension. He does not have a history of hyperlipidemia or diabetes mellitus.  PMHx:  Past Medical History  Diagnosis Date  . Prostate cancer   . Atrial fibrillation   . Hypertension   . Esophageal cancer    Past Surgical History  Procedure Laterality Date  . Appendectomy    . Prostate surgery      FAMHx: No family history of inheritable disorders.Marland Kitchen  SOCHx:  reports that he has never smoked. He does not have any smokeless tobacco history on file. He reports that he does not drink alcohol. His drug history is not on file.  ALLERGIES: No known allergies  ROS: Constitutional: negative for anorexia, chills, fevers, sweats and weight loss Eyes: negative Ears, nose, mouth, throat, and face: positive for hoarseness and snoring, negative for epistaxis and sore throat Respiratory: positive for dyspnea on exertion and stridor, negative for cough, hemoptysis, pleurisy/chest pain and sputum Cardiovascular:  negative for exertional chest pressure/discomfort, lower extremity edema and syncope Gastrointestinal: positive for reflux symptoms, negative for constipation, diarrhea, melena, nausea and vomiting Genitourinary:positive for frequency and nocturia, negative for hematuria and urinary incontinence Hematologic/lymphatic: negative  for bleeding, easy bruising and petechiae Musculoskeletal:positive for arthralgias Neurological: negative for coordination problems, gait problems, headaches, seizures, speech problems and weakness Behavioral/Psych: negative Endocrine: negative for diabetic symptoms including polydipsia, polyphagia and polyuria    HOSPITAL MEDICATIONS: I have reviewed the patient's current medications. Prior to Admission:  Prescriptions prior to admission  Medication Sig Dispense Refill Last Dose  . Ascorbic Acid (VITAMIN C) 1000 MG tablet Take 1,000 mg by mouth daily.   05/12/2014 at Unknown time  . aspirin 325 MG tablet Take 325 mg by mouth daily.   05/12/2014 at Unknown time  . B Complex Vitamins (VITAMIN B COMPLEX PO) Take 100 mg by mouth daily.    05/12/2014 at Unknown time  . Calcium Carbonate-Vit D-Min (CALCIUM 1200 PO) Take 1 tablet by mouth daily.    05/12/2014 at Unknown time  . Cholecalciferol 2000 UNITS TABS Take 2,000 Units by mouth daily.   05/12/2014 at Unknown time  . Coenzyme Q10 (CO Q 10) 100 MG CAPS Take 100 mg by mouth daily.   05/12/2014 at Unknown time  . FOLIC ACID PO Take 324 mcg by mouth daily.    05/12/2014 at Unknown time  . L-ARGININE-500 PO Take 1 tablet by mouth daily.    05/11/2014 at Unknown time  . levothyroxine (SYNTHROID, LEVOTHROID) 75 MCG tablet Take 75 mcg by mouth daily before breakfast.   05/12/2014 at Unknown time  . lisinopril (PRINIVIL,ZESTRIL) 10 MG tablet Take 10 mg by mouth daily.   05/12/2014 at Unknown time  . magnesium oxide (MAG-OX) 400 MG tablet Take 400 mg by mouth daily.   05/12/2014 at Unknown time  . milk thistle 175 MG tablet Take 175 mg by mouth daily.   05/11/2014 at Unknown time  . Suma Root POWD Take 1 tablet by mouth 3 (three) times daily.    05/12/2014 at Unknown time  . tamsulosin (FLOMAX) 0.4 MG CAPS capsule Take 0.4 mg by mouth daily.    05/12/2014 at Unknown time  . vitamin E 400 UNIT capsule Take 400 Units by mouth daily.   05/12/2014 at Unknown time  . Zinc Acetate,  Oral, (ZINC ACETATE PO) Take 65 mg by mouth daily.    05/11/2014 at Unknown time   Scheduled: . aspirin  325 mg Oral Daily  . B-complex with vitamin C  1 tablet Oral Daily  . enoxaparin (LOVENOX) injection  40 mg Subcutaneous Q24H  . [START ON 05/14/2014] levothyroxine  75 mcg Oral QAC breakfast  . lisinopril  10 mg Oral Daily  . nitroGLYCERIN  0.5 inch Topical 4 times per day  . sodium chloride  3 mL Intravenous Q12H  . sodium chloride  3 mL Intravenous Q12H  . tamsulosin  0.4 mg Oral QPC breakfast    VITALS: Blood pressure 112/57, pulse 66, temperature 97.4 F (36.3 C), temperature source Oral, resp. rate 16, height 5\' 7"  (1.702 m), weight 181 lb (82.101 kg), SpO2 97 %.  PHYSICAL EXAM:  General: Alert, oriented x3, no distress Head: no evidence of trauma, PERRL, EOMI, no exophtalmos or lid lag, no myxedema, no xanthelasma; normal ears, nose and oropharynx Neck: Normal jugular venous pulsations and no hepatojugular reflux; brisk carotid pulses without delay and no carotid bruits Chest: clear to auscultation, no signs of consolidation by percussion or  palpation, normal fremitus, symmetrical and full respiratory excursions Cardiovascular: normal position and quality of the apical impulse, irregular rhythm, normal first heart sound normal second heart sound, no rubs or gallops, no murmur Abdomen: no tenderness or distention, no masses by palpation, no abnormal pulsatility or arterial bruits, normal bowel sounds, no hepatosplenomegaly Extremities: no clubbing, cyanosis;  no edema; 2+ radial, ulnar and brachial pulses bilaterally; 2+ right femoral, posterior tibial and dorsalis pedis pulses; 2+ left femoral, posterior tibial and dorsalis pedis pulses; no subclavian or femoral bruits Neurological grossly nonfocal   LABS  CBC  Recent Labs  05/12/14 1220 05/13/14 0645  WBC 11.1* 6.1  NEUTROABS 8.9*  --   HGB 13.6 13.4  HCT 40.4 38.8*  MCV 95.1 93.9  PLT 135* 544*   Basic Metabolic  Panel  Recent Labs  05/12/14 1220 05/13/14 0645  NA 135 139  K 4.2 4.2  CL 105 105  CO2 26 26  GLUCOSE 107* 81  BUN 32* 28*  CREATININE 1.02 1.13  CALCIUM 9.1 9.0   Liver Function Tests  Recent Labs  05/12/14 1220  AST 37  ALT 21  ALKPHOS 47  BILITOT 0.7  PROT 7.0  ALBUMIN 4.3   No results for input(s): LIPASE, AMYLASE in the last 72 hours. Cardiac Enzymes  Recent Labs  05/12/14 2100 05/13/14 0109 05/13/14 0645  TROPONINI <0.03 0.03 <0.03     IMAGING: Dg Chest 2 View  05/12/2014   CLINICAL DATA:  Shortness of breath for 2 days.  Initial encounter.  EXAM: CHEST  2 VIEW  COMPARISON:  Portable chest 05/11/2014.  FINDINGS: Persistent cardiomegaly. No active infiltrates or failure. Old rib fractures are stable on the LEFT. No effusion or pneumothorax. Thoracic atherosclerosis.  IMPRESSION: Stable chest.  No active disease.   Electronically Signed   By: Rolla Flatten M.D.   On: 05/12/2014 13:15   Ct Chest Wo Contrast  05/12/2014   CLINICAL DATA:  Shortness of breath for 2 days.  EXAM: CT CHEST WITHOUT CONTRAST  TECHNIQUE: Multidetector CT imaging of the chest was performed following the standard protocol without IV contrast.  COMPARISON:  05/12/2014 chest x-ray.  Chest CT 03/05/2014.  FINDINGS: Heart is mildly enlarged. Diffuse coronary artery calcifications and aortic calcifications. No aneurysm. Chest wall soft tissues are unremarkable.  5 mm nodule of the right lung base. This was not definitively seen on prior study. Lungs otherwise clear. No pleural effusions.  Gallstones noted within the gallbladder. No acute findings in the upper abdomen. No acute bony abnormality or focal bone lesion.  IMPRESSION: 5 mm nodule in the right lower lobe at the right lung base. If the patient is at high risk for bronchogenic carcinoma, follow-up chest CT at 6-12 months is recommended. If the patient is at low risk for bronchogenic carcinoma, follow-up chest CT at 12 months is recommended. This  recommendation follows the consensus statement: Guidelines for Management of Small Pulmonary Nodules Detected on CT Scans: A Statement from the [place] as published in Radiology 2005;237:395-400.  Cardiomegaly.  Diffuse coronary artery disease.   Electronically Signed   By: Rolm Baptise M.D.   On: 05/12/2014 14:18    ECG: Atrial fibrillation, delayed anterior R-wave progression, otherwise normal:   TELEMETRY: Afib with controlled rate  IMPRESSION/RECOMMENDATION: 1. Stridor and dyspnea appear related to upper airway obstruction. Consider reflux related laryngospasm (also conceivably worse with supine position), vocal cord dysfunction, recurrent laryngeal nerve palsy (recurrent carcinoma, delayed radiation scarring, lymphadenopathy, etc.) Suggest ENT evaluation. After  witnessing his "episode", I doubt his dyspnea represents CHF/angina equivalent. 2. Permanent AFib, rate controlled. Refused Eliquis, prefers ASA. 3. Severe coronary artery calcifications - no angina, reportedly normal nuclear stress test last January and normal EF (records requested from [facility]). If confirmed, no further workup at this time.  Time Spent Directly with Patient: 60 minutes  Sanda Klein, MD, [facility] [phone] office [phone] pager   05/13/2014, 10:45 AM

## 2014-05-14 NOTE — Progress Notes (Signed)
Pt discharged via wheelchair, accompanied by family, condition stable.

## 2014-05-14 NOTE — Discharge Summary (Signed)
Physician Discharge Summary  Daniel Jefferson YNW:[id number] DOB: 10-May-1931 DOA: 05/12/2014  PCP: Mia Creek, MD  Admit date: 05/12/2014 Discharge date: 05/14/2014  Time spent: 40 minutes  Recommendations for Outpatient Follow-up:  1. Follow up with Dr. Laurance Flatten as out pt for further evaluation of SOB and upper airways.  Discharge Diagnoses:  Principal Problem:   SOB (shortness of breath) Active Problems:   Lung nodule, solitary   Chronic atrial fibrillation   Paroxysmal nocturnal dyspnea   Hypertension   Prostate cancer   Coronary artery disease involving native coronary artery of native heart without angina pectoris   Esophageal cancer   AKI (acute kidney injury)   Discharge Condition: Stable  Diet recommendation: Puree  Filed Weights   05/12/14 1913 05/13/14 0456 05/14/14 0441  Weight: 82.101 kg (181 lb) 82.101 kg (181 lb) 80.287 kg (177 lb)    History of present illness:  Daniel Jefferson is a 81 y.o. male with a history of Chronic Atrial Fibrillation on ASA RX, HTN, and remote history of Prostate Cancer S/P Radiation Seed Implants, and remote history of Esophageal Cancer S/P Surgical Resection, and Chemo RX and Radiation RX who presented to the [facility] ED with complaints of paroxysms of SOB and Chest pain as well as orthopnea . He was seen in the ED at [facility] 1 days ago evaluated and released to home, but he continued to have paroxysms of SOB and was brought to the [facility] ED. He was evaluated in the ED and had a CT scan of the Chest perfomed which revealed a 5mmLung Nodule in the R Lung. Cardiac Enzymes were also performed and were negative. He was referred to [facility] for further evaluation.    Hospital Course:   Shortness of breath Patient complains about paroxysmal shortness of breath without clear precipitating factors. Denies any chest pain without, denies any sweating or nausea. 2-D echo showed no abnormalities Cardiology consulted, apparently patient  had an episode while Dr. Recardo Evangelist is in the room. This appears to be upper airway problem, no evidence of acute pulmonary edema. Patient needs follow-up with ENT as outpatient for evaluation of upper airways.  Chronic atrial fibrillation Rate is controlled, continue home medications. Patient is not on anticoagulation, refused Eliquis, wants to continue aspirin.  Lung nodule Patient has history of 2 primary cancers including prostate and HEENT cancer. Recommend to follow RLL 5 mm nodule as outpatient very closely.  Hypertension Is controlled, continue home medications.  Prostate and esophageal cancer Remote cancer, both treated with resection and radiation. Pulmonary nodule, solitary, follows outpatient.    Procedures:  2D Echo Study Conclusions  - Procedure narrative: Transthoracic echocardiography. Image quality was adequate. The study was technically difficult. - Left ventricle: The cavity size was normal. There was moderate concentric hypertrophy. Systolic function was normal. The estimated ejection fraction was in the range of 60% to 65%. Wall motion was normal; there were no regional wall motion abnormalities. - Left atrium: The atrium was moderately to severely dilated. - Right atrium: The atrium was moderately dilated.  Consultations:  Cardiology  Discharge Exam: Filed Vitals:   05/14/14 0441  BP: 102/43  Pulse:   Temp: 98.2 F (36.8 C)  Resp: 10   General: Alert and awake, oriented x3, not in any acute distress. HEENT: anicteric sclera, pupils reactive to light and accommodation, EOMI CVS: S1-S2 clear, no murmur rubs or gallops Chest: clear to auscultation bilaterally, no wheezing, rales or rhonchi Abdomen: soft nontender, nondistended, normal bowel sounds, no organomegaly Extremities: no  cyanosis, clubbing or edema noted bilaterally Neuro: Cranial nerves II-XII intact, no focal neurological deficits  Discharge Instructions   Discharge  Instructions    Increase activity slowly    Complete by:  As directed           Current Discharge Medication List    CONTINUE these medications which have NOT CHANGED   Details  Ascorbic Acid (VITAMIN C) 1000 MG tablet Take 1,000 mg by mouth daily.    aspirin 325 MG tablet Take 325 mg by mouth daily.    B Complex Vitamins (VITAMIN B COMPLEX PO) Take 100 mg by mouth daily.     Calcium Carbonate-Vit D-Min (CALCIUM 1200 PO) Take 1 tablet by mouth daily.     Cholecalciferol 2000 UNITS TABS Take 2,000 Units by mouth daily.    Coenzyme Q10 (CO Q 10) 100 MG CAPS Take 100 mg by mouth daily.    FOLIC ACID PO Take 324 mcg by mouth daily.     L-ARGININE-500 PO Take 1 tablet by mouth daily.     levothyroxine (SYNTHROID, LEVOTHROID) 75 MCG tablet Take 75 mcg by mouth daily before breakfast.    lisinopril (PRINIVIL,ZESTRIL) 10 MG tablet Take 10 mg by mouth daily.    magnesium oxide (MAG-OX) 400 MG tablet Take 400 mg by mouth daily.    milk thistle 175 MG tablet Take 175 mg by mouth daily.    Suma Root POWD Take 1 tablet by mouth 3 (three) times daily.     tamsulosin (FLOMAX) 0.4 MG CAPS capsule Take 0.4 mg by mouth daily.     vitamin E 400 UNIT capsule Take 400 Units by mouth daily.    Zinc Acetate, Oral, (ZINC ACETATE PO) Take 65 mg by mouth daily.        Not on File Follow-up Information    Follow up with Mia Creek, MD In 1 week.   Specialty:  Unknown Physician Specialty   Contact information:   [address]. [place] [address] [phone]       Follow up with [facility] MD In 1 week.   Specialty:  Family Medicine   Contact information:   [address] [place] [address] [phone]        The results of significant diagnostics from this hospitalization (including imaging, microbiology, ancillary and laboratory) are listed below for reference.    Significant Diagnostic Studies: Dg Chest 2 View  05/12/2014   CLINICAL DATA:   Shortness of breath for 2 days.  Initial encounter.  EXAM: CHEST  2 VIEW  COMPARISON:  Portable chest 05/11/2014.  FINDINGS: Persistent cardiomegaly. No active infiltrates or failure. Old rib fractures are stable on the LEFT. No effusion or pneumothorax. Thoracic atherosclerosis.  IMPRESSION: Stable chest.  No active disease.   Electronically Signed   By: Rolla Flatten M.D.   On: 05/12/2014 13:15   Ct Chest Wo Contrast  05/12/2014   CLINICAL DATA:  Shortness of breath for 2 days.  EXAM: CT CHEST WITHOUT CONTRAST  TECHNIQUE: Multidetector CT imaging of the chest was performed following the standard protocol without IV contrast.  COMPARISON:  05/12/2014 chest x-ray.  Chest CT 03/05/2014.  FINDINGS: Heart is mildly enlarged. Diffuse coronary artery calcifications and aortic calcifications. No aneurysm. Chest wall soft tissues are unremarkable.  5 mm nodule of the right lung base. This was not definitively seen on prior study. Lungs otherwise clear. No pleural effusions.  Gallstones noted within the gallbladder. No acute findings  in the upper abdomen. No acute bony abnormality or focal bone lesion.  IMPRESSION: 5 mm nodule in the right lower lobe at the right lung base. If the patient is at high risk for bronchogenic carcinoma, follow-up chest CT at 6-12 months is recommended. If the patient is at low risk for bronchogenic carcinoma, follow-up chest CT at 12 months is recommended. This recommendation follows the consensus statement: Guidelines for Management of Small Pulmonary Nodules Detected on CT Scans: A Statement from the [place] as published in Radiology 2005;237:395-400.  Cardiomegaly.  Diffuse coronary artery disease.   Electronically Signed   By: Rolm Baptise M.D.   On: 05/12/2014 14:18    Microbiology: No results found for this or any previous visit (from the past 240 hour(s)).   Labs: Basic Metabolic Panel:  Recent Labs Lab 05/12/14 1220 05/13/14 0645  NA 135 139  K 4.2 4.2  CL 105  105  CO2 26 26  GLUCOSE 107* 81  BUN 32* 28*  CREATININE 1.02 1.13  CALCIUM 9.1 9.0   Liver Function Tests:  Recent Labs Lab 05/12/14 1220  AST 37  ALT 21  ALKPHOS 47  BILITOT 0.7  PROT 7.0  ALBUMIN 4.3   No results for input(s): LIPASE, AMYLASE in the last 168 hours. No results for input(s): AMMONIA in the last 168 hours. CBC:  Recent Labs Lab 05/12/14 1220 05/13/14 0645  WBC 11.1* 6.1  NEUTROABS 8.9*  --   HGB 13.6 13.4  HCT 40.4 38.8*  MCV 95.1 93.9  PLT 135* 121*   Cardiac Enzymes:  Recent Labs Lab 05/12/14 1220 05/12/14 2100 05/13/14 0109 05/13/14 0645  TROPONINI <0.03 <0.03 0.03 <0.03   BNP: BNP (last 3 results)  Recent Labs  05/12/14 1220  BNP 153.6*    ProBNP (last 3 results) No results for input(s): PROBNP in the last 8760 hours.  CBG: No results for input(s): GLUCAP in the last 168 hours.     Signed:  Aracelli Woloszyn A  [facility] 05/14/2014, 8:55 AM

## 2016-01-20 IMAGING — CT CT CHEST W/O CM
2 of 3 series · 15 of 36 positions shown, 18 images · non-contrast
Comparison: 05/12/2014 chest x-ray.  Chest CT 03/05/2014.

CLINICAL DATA: Shortness of breath for 2 days.

EXAM:
CT CHEST WITHOUT CONTRAST
TECHNIQUE: Multidetector CT imaging of the chest was performed following the
standard protocol without IV contrast..

[Series 2: chest 5.0 b31f · axial · 0.76mm/px · z∈[+1162,+1436]mm · 12 of 65 slices shown, 15 images]
[im 5/65  mediastinal]
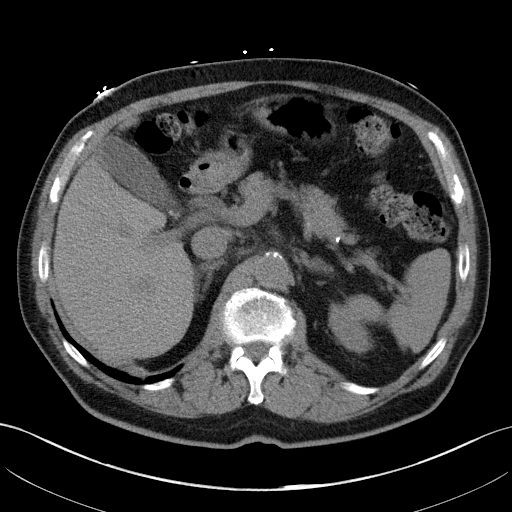
[im 5/65  lung]
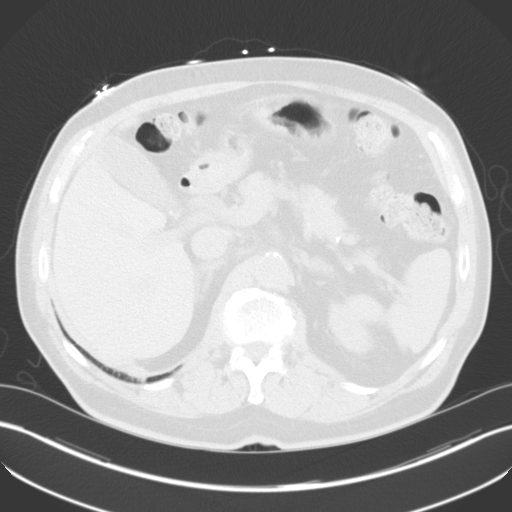
[im 10/65  lung]
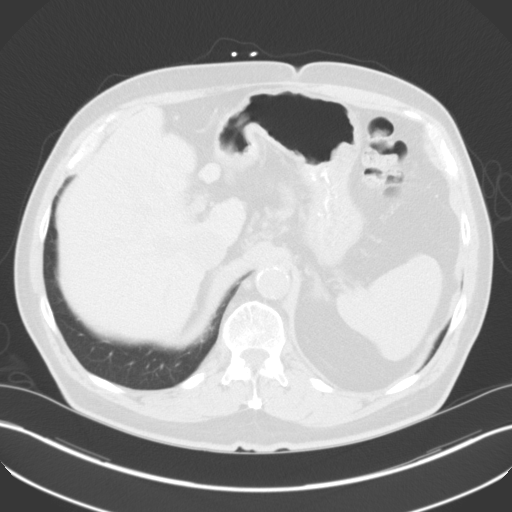
[im 15/65  lung]
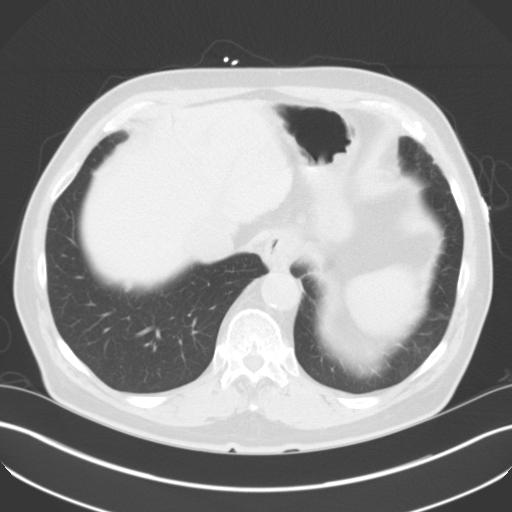
[im 19/65  lung]
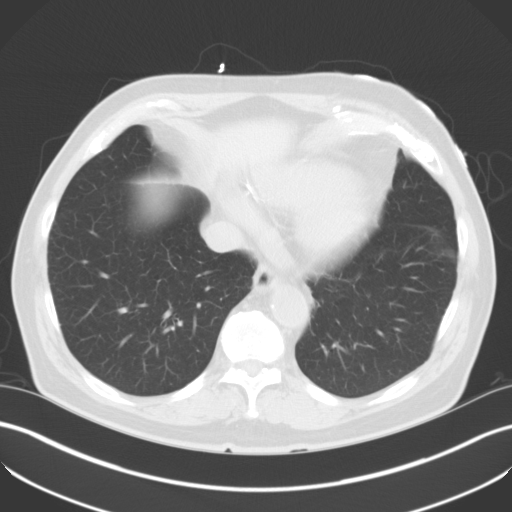
[im 24/65  mediastinal]
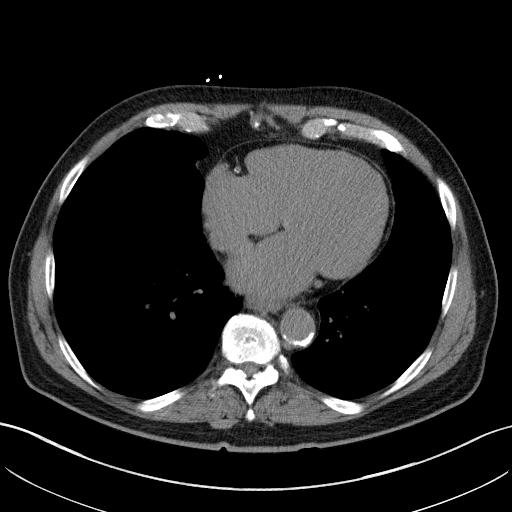
[im 24/65  lung]
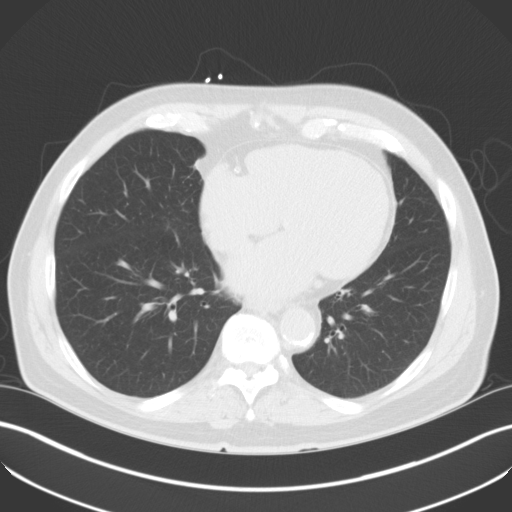
[im 29/65  lung]
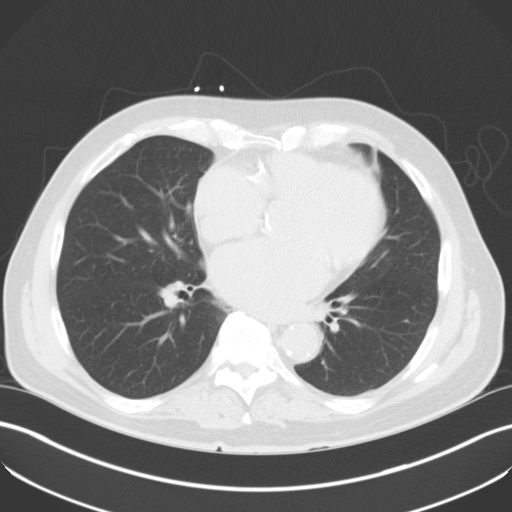
[im 36/65  lung]
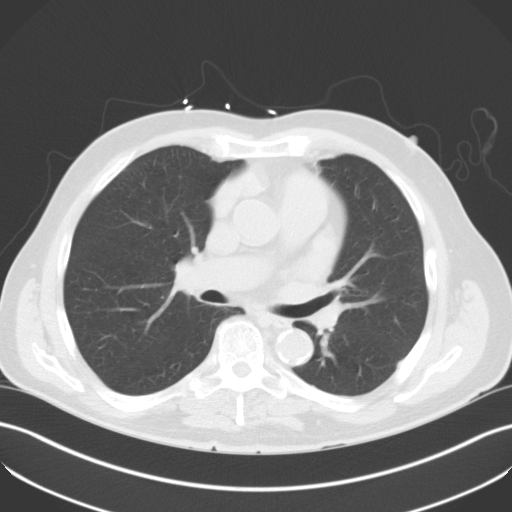
[im 41/65  lung]
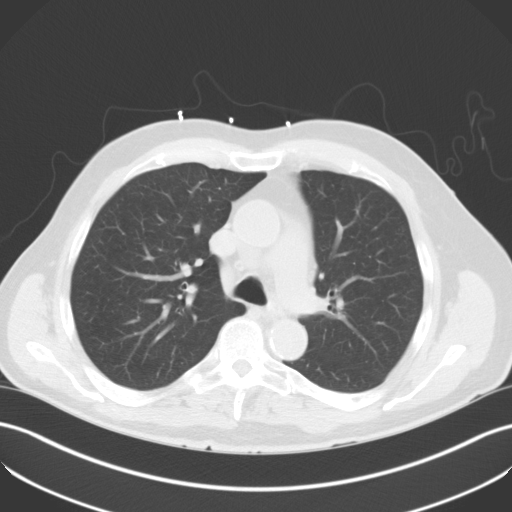
[im 46/65  mediastinal]
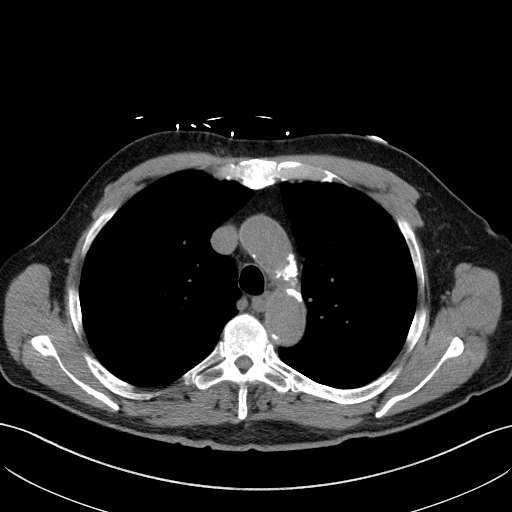
[im 46/65  lung]
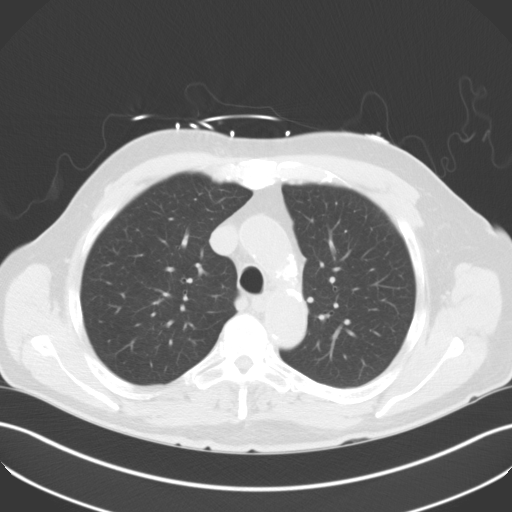
[im 50/65  lung]
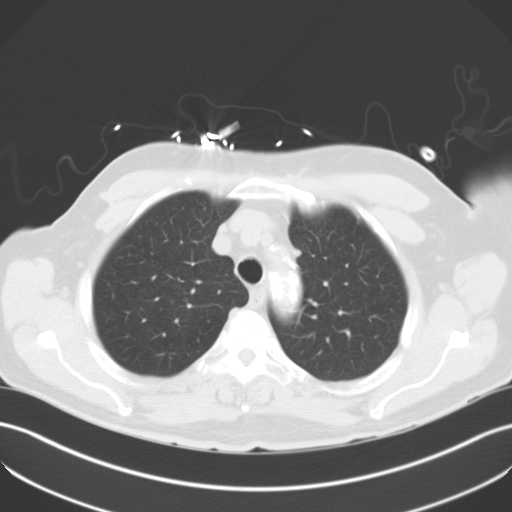
[im 55/65  lung]
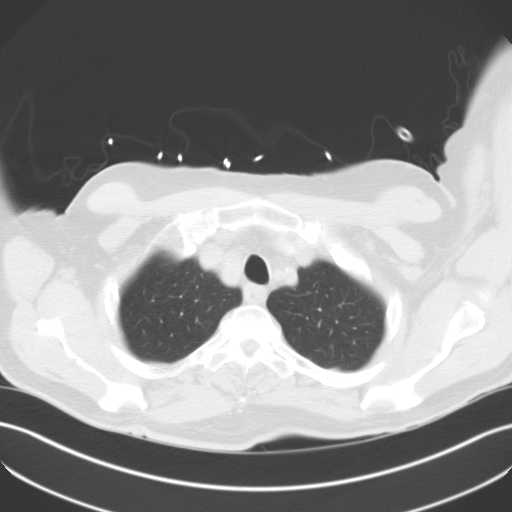
[im 60/65  lung]
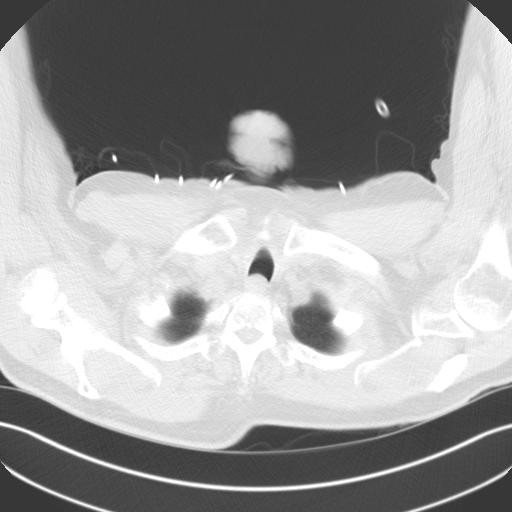

[Series 6: chest 3.0 coronal · coronal · 0.69mm/px · 3 of 100 slices shown]
[im 20/100  lung]
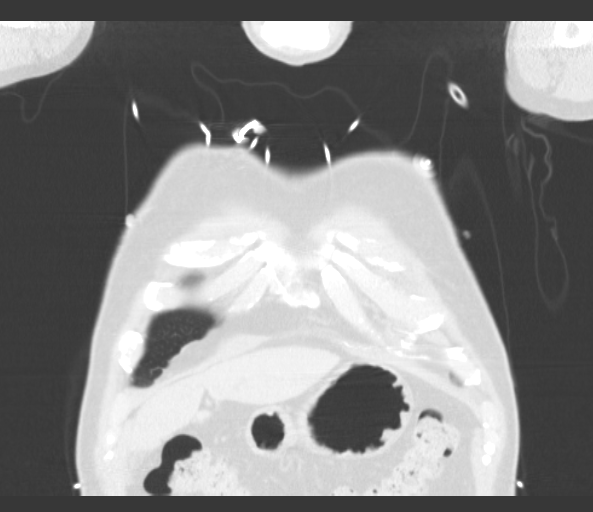
[im 40/100  lung]
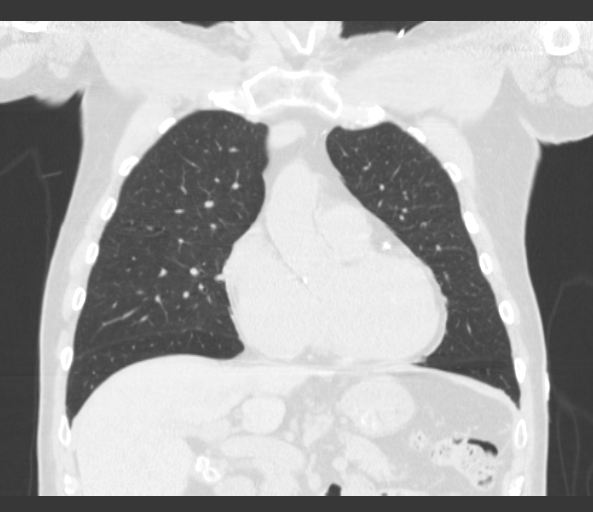
[im 60/100  lung]
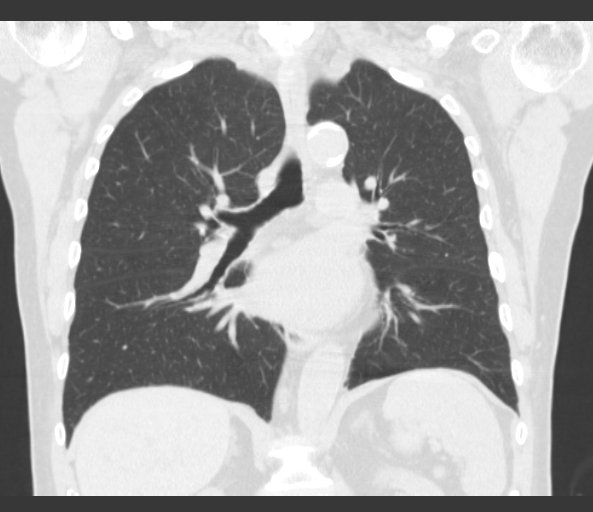

[15 of 36 positions shown; findings below may reference images not displayed]

FINDINGS: Heart is mildly enlarged. Diffuse coronary artery calcifications and
aortic calcifications. No aneurysm. Chest wall soft tissues are
unremarkable.

5 mm nodule of the right lung base. This was not definitively seen
on prior study. Lungs otherwise clear. No pleural effusions.

Gallstones noted within the gallbladder. No acute findings in the
upper abdomen. No acute bony abnormality or focal bone lesion.
IMPRESSION: 5 mm nodule in the right lower lobe at the right lung base. If the
patient is at high risk for bronchogenic carcinoma, follow-up chest
CT at 6-12 months is recommended. If the patient is at low risk for
bronchogenic carcinoma, follow-up chest CT at 12 months is
recommended. This recommendation follows the consensus statement:
Guidelines for Management of Small Pulmonary Nodules Detected on CT
Scans: A Statement from the [HOSPITAL] as published in

Cardiomegaly.  Diffuse coronary artery disease.

## 2017-06-17 ENCOUNTER — Ambulatory Visit: Payer: Self-pay | Admitting: Podiatry

## 2017-06-20 ENCOUNTER — Ambulatory Visit: Payer: Self-pay | Admitting: Podiatry

## 2019-03-13 DEATH — deceased
